# Patient Record
Sex: Male | Born: 1970 | Race: White | Hispanic: No | Marital: Married | Smoking: Former smoker
Health system: Southern US, Community
[De-identification: ages and names within clinical notes are randomized; demographics above are authoritative.]

## PROBLEM LIST (undated history)

## (undated) DIAGNOSIS — G4730 Sleep apnea, unspecified: Secondary | ICD-10-CM

## (undated) DIAGNOSIS — Z1331 Encounter for screening for depression: Secondary | ICD-10-CM

## (undated) DIAGNOSIS — F419 Anxiety disorder, unspecified: Secondary | ICD-10-CM

## (undated) DIAGNOSIS — E782 Mixed hyperlipidemia: Secondary | ICD-10-CM

## (undated) DIAGNOSIS — E785 Hyperlipidemia, unspecified: Secondary | ICD-10-CM

## (undated) DIAGNOSIS — E669 Obesity, unspecified: Secondary | ICD-10-CM

## (undated) DIAGNOSIS — Z8616 Personal history of COVID-19: Secondary | ICD-10-CM

## (undated) DIAGNOSIS — R0609 Other forms of dyspnea: Secondary | ICD-10-CM

## (undated) DIAGNOSIS — J45909 Unspecified asthma, uncomplicated: Secondary | ICD-10-CM

## (undated) DIAGNOSIS — R0600 Dyspnea, unspecified: Secondary | ICD-10-CM

## (undated) DIAGNOSIS — F325 Major depressive disorder, single episode, in full remission: Secondary | ICD-10-CM

## (undated) HISTORY — DX: Encounter for screening for depression: Z13.31

## (undated) HISTORY — DX: Obesity, unspecified: E66.9

## (undated) HISTORY — DX: Anxiety disorder, unspecified: F41.9

## (undated) HISTORY — DX: Hyperlipidemia, unspecified: E78.5

## (undated) HISTORY — DX: Unspecified asthma, uncomplicated: J45.909

## (undated) HISTORY — DX: Major depressive disorder, single episode, in full remission: F32.5

## (undated) HISTORY — DX: Mixed hyperlipidemia: E78.2

## (undated) HISTORY — PX: NO PAST SURGERIES: SHX2092

## (undated) HISTORY — PX: OTHER SURGICAL HISTORY: SHX169

## (undated) HISTORY — DX: Personal history of COVID-19: Z86.16

## (undated) HISTORY — DX: Dyspnea, unspecified: R06.00

## (undated) HISTORY — DX: Sleep apnea, unspecified: G47.30

## (undated) HISTORY — DX: Other forms of dyspnea: R06.09

---

## 2010-08-04 ENCOUNTER — Ambulatory Visit: Payer: Self-pay | Admitting: Cardiovascular Disease

## 2010-08-13 ENCOUNTER — Ambulatory Visit: Payer: Self-pay | Admitting: Cardiovascular Disease

## 2011-03-02 NOTE — Assessment & Plan Note (Signed)
[facility]                        [facility] OFFICE NOTE   NAME:ASBILLDelmos, [name]                         MRN:          [id number]  DATE:08/13/2010                            DOB:          1971-07-05    Francisco Underwood is a 40 year old gentleman who is here today for a followup  visit.  He has the following problem list:  1. Sleep apnea.  2. Hyperlipidemia.  3. Anxiety.  4. Previous tobacco use.   INTERVAL HISTORY:  I evaluated [name] recently for symptoms of  atypical chest pain and exertional dyspnea.  Some of his episodes were  happening with activities, but it was not on a consistent basis.  He  does not exercise regularly.  He has not had any changes in his  symptoms.  Most of his symptoms right now are exertional dyspnea and  occasional tightness in his chest at extreme physical activities.   PHYSICAL EXAMINATION:  VITAL SIGNS:  Weight is 193.6 pounds, blood  pressure is 119/73, pulse is 78, oxygen saturation is 97% on room air.  NECK:  No JVD or carotid bruits.  LUNGS:  Clear to auscultation.  HEART:  Regular rate and rhythm with no gallops or murmurs.  ABDOMEN:  Benign, nontender, nondistended.  EXTREMITIES:  With no clubbing, cyanosis, or edema.   IMPRESSION:  Atypical chest pain and exertional dyspnea.  He underwent a  stress echocardiogram.  Overall, he was able to exercise for 11 minutes  and achieved a maximum heart rate of 171.  He had no chest pain at that  time, no EKG changes, and no echocardiographic findings.  Overall, he  has normal LV systolic function with no evidence of ischemia by  echocardiogram.  Based on the high workload that he achieved, it is very  doubtful that his symptoms are cardiac in nature.  His exercise capacity  was actually good.  No need for further cardiac evaluation at this time.  I asked him to continue taking aspirin 81 mg once daily.  I advised him  to start an exercise program with at least 30  minutes of aerobic cardiac  exercises at least five times a week.  I think some weight loss will  help his sleep apnea as well.  He is to follow up with me on an as-  needed basis if his symptoms get worse.     Francisco Bears, MD  Electronically Signed    MA/MedQ  DD: 08/13/2010  DT: 08/14/2010  Job #: [id number]

## 2011-03-02 NOTE — Letter (Signed)
August 04, 2010    Lucila Maine, M.D.  [address]  [place], [place]  [address]   RE:  Francisco Underwood, Francisco Underwood  MRN:  [id number]  /  DOB:  August 01, 1971   Dear Dr. Lorin Picket:   Thank you for referring Francisco Underwood for further cardiac evaluation.  As  you are aware, this is a pleasant 40 year old gentleman with the  following problem list:  1. Sleep apnea on CPAP.  2. Hyperlipidemia.  3. Anxiety.  4. Previous tobacco use.   CLINICAL HISTORY:  Francisco Underwood is here today for evaluation of chest pain  and dyspnea.  He has noticed this over the last few weeks.  He describes  substernal chest tightness with which happens when he involves in some  kind of exercise or exerting himself to a certain point.  The discomfort  does not radiate.  It resolves with rest.  It lasts for a few minutes.  What bothers him more though it is getting out of breath easily with  activity.  He seems to have left arm discomfort also, but that does not  seem to be at the same time with his chest discomfort.  The patient has  not been doing any regular exercise in the last few years.  He has no  previous cardiac history.   PAST MEDICAL HISTORY:  Is as outlined above.   MEDICATIONS:  1. Wellbutrin 125 mg once daily.  2. Lexapro once daily.  3. Aspirin 81 mg once daily.   ALLERGIES:  No known drug allergies.   SOCIAL HISTORY:  He quit smoking in 1999.  He denies any recreational or  drug use.  He drinks coffees a day.  The patient is married and has  three kids.   PAST SURGICAL HISTORY:  None.   FAMILY HISTORY:  Negative for premature coronary artery disease.  There  is family history of hypertension and diabetes.   REVIEW OF SYSTEMS:  This is remarkable for chest pain and dyspnea as  outlined above.  There is also anxiety.  A full review of system was  performed and is otherwise negative.   PHYSICAL EXAMINATION:  GENERAL APPEARANCE:  The patient is pleasant and  appears to be in no acute distress.  VITAL SIGNS:   Weight is 192.6 pounds, blood pressure is 134/83, pulse is  99, oxygen saturation is 97% on room air.  HEENT:  Normocephalic, atraumatic.  NECK:  No JVD or carotid bruits.  RESPIRATORY:  Normal respiratory effort with no use of accessory  muscles.  Auscultation reveals normal breath sounds.  CARDIOVASCULAR:  Normal PMI.  Normal S1, S2 with no gallops or murmurs.  ABDOMEN:  Benign, nontender, nondistended.  EXTREMITIES:  With no clubbing, cyanosis or edema.  SKIN:  Skin is warm and dry with no rash.  PSYCHIATRIC:  He is alert, oriented x3 with normal mood and affect.  MUSCULOSKELETAL:  There is normal muscle strength in the upper and lower  extremities.   His electrocardiogram from today was reviewed which showed normal sinus  rhythm with nonspecific ST changes in the inferior leads.   IMPRESSION:  Chest pain:  His symptoms have been mostly exertional.  Differential diagnosis include underlying ischemic heart disease versus  physical deconditioning due to lack of exercise.  His risk factors  include hyperlipidemia and previous history of smoking.  Further testing  is warranted.  I recommend proceeding with a stress echocardiogram for  further evaluation.  In the meantime, will continue with aspirin once  daily.  He will follow up after stress test for further recommendations.   Thank you for allowing me to participate in the care of your patient.    Sincerely,      Lorine Bears, MD  Electronically Signed    MA/MedQ  DD: 08/04/2010  DT: 08/04/2010  Job #: [id number]

## 2011-06-09 ENCOUNTER — Encounter: Payer: Self-pay | Admitting: Cardiovascular Disease

## 2011-06-17 ENCOUNTER — Encounter: Payer: Self-pay | Admitting: Cardiovascular Disease

## 2018-07-05 DIAGNOSIS — Z1339 Encounter for screening examination for other mental health and behavioral disorders: Secondary | ICD-10-CM | POA: Diagnosis not present

## 2018-07-05 DIAGNOSIS — M62838 Other muscle spasm: Secondary | ICD-10-CM | POA: Diagnosis not present

## 2018-07-05 DIAGNOSIS — M5412 Radiculopathy, cervical region: Secondary | ICD-10-CM | POA: Diagnosis not present

## 2018-07-05 DIAGNOSIS — L309 Dermatitis, unspecified: Secondary | ICD-10-CM | POA: Diagnosis not present

## 2018-12-29 DIAGNOSIS — Z125 Encounter for screening for malignant neoplasm of prostate: Secondary | ICD-10-CM | POA: Diagnosis not present

## 2018-12-29 DIAGNOSIS — E669 Obesity, unspecified: Secondary | ICD-10-CM | POA: Diagnosis not present

## 2018-12-29 DIAGNOSIS — Z0000 Encounter for general adult medical examination without abnormal findings: Secondary | ICD-10-CM | POA: Diagnosis not present

## 2018-12-29 DIAGNOSIS — Z6831 Body mass index (BMI) 31.0-31.9, adult: Secondary | ICD-10-CM | POA: Diagnosis not present

## 2018-12-29 DIAGNOSIS — Z13220 Encounter for screening for lipoid disorders: Secondary | ICD-10-CM | POA: Diagnosis not present

## 2018-12-29 DIAGNOSIS — Z1331 Encounter for screening for depression: Secondary | ICD-10-CM | POA: Diagnosis not present

## 2019-11-06 DIAGNOSIS — U071 COVID-19: Secondary | ICD-10-CM | POA: Diagnosis not present

## 2019-11-06 DIAGNOSIS — R0981 Nasal congestion: Secondary | ICD-10-CM | POA: Diagnosis not present

## 2019-11-16 DIAGNOSIS — U071 COVID-19: Secondary | ICD-10-CM | POA: Diagnosis not present

## 2020-02-28 DIAGNOSIS — R0600 Dyspnea, unspecified: Secondary | ICD-10-CM | POA: Diagnosis not present

## 2020-02-28 DIAGNOSIS — E782 Mixed hyperlipidemia: Secondary | ICD-10-CM | POA: Diagnosis not present

## 2020-02-28 DIAGNOSIS — F325 Major depressive disorder, single episode, in full remission: Secondary | ICD-10-CM | POA: Diagnosis not present

## 2020-02-28 DIAGNOSIS — J45909 Unspecified asthma, uncomplicated: Secondary | ICD-10-CM | POA: Diagnosis not present

## 2020-03-05 ENCOUNTER — Encounter: Payer: Self-pay | Admitting: General Practice

## 2020-04-02 ENCOUNTER — Other Ambulatory Visit: Payer: Self-pay

## 2020-04-02 ENCOUNTER — Ambulatory Visit: Payer: BC Managed Care – PPO | Admitting: Cardiology

## 2020-04-02 DIAGNOSIS — R0600 Dyspnea, unspecified: Secondary | ICD-10-CM | POA: Diagnosis not present

## 2020-04-02 DIAGNOSIS — E782 Mixed hyperlipidemia: Secondary | ICD-10-CM

## 2020-04-02 DIAGNOSIS — R0609 Other forms of dyspnea: Secondary | ICD-10-CM | POA: Insufficient documentation

## 2020-04-02 HISTORY — DX: Dyspnea, unspecified: R06.00

## 2020-04-02 HISTORY — DX: Other forms of dyspnea: R06.09

## 2020-04-02 HISTORY — DX: Mixed hyperlipidemia: E78.2

## 2020-04-02 NOTE — Progress Notes (Signed)
Cardiology Office Note:    Date:  04/02/2020   ID:  Francisco Underwood, DOB 10/07/1971, MRN [id number]  PCP:  Elenore Paddy, NP  Cardiologist:  Jenean Lindau, MD   Referring MD: Elenore Paddy, NP    ASSESSMENT:    1. DOE (dyspnea on exertion)   2. Mixed dyslipidemia    PLAN:    In order of problems listed above:  1. Primary prevention stressed with the patient.  Importance of compliance with diet medication stressed and he vocalized understanding. 2. Dyspnea on exertion: Patient has multiple risk factors for coronary artery disease and therefore we will do a Lexiscan sestamibi to understand his symptoms.  This will give [place] any objective evidence of possible coronary artery disease 3. Mixed dyslipidemia: Diet was emphasized.  Weight reduction was stressed.  He is promising to comply. 4. History of sleep apnea: Sleep health issues were discussed.  This is managed by his primary care provider. 5. Patient will be seen in follow-up appointment in 6 months or earlier if the patient has any concerns    Medication Adjustments/Labs and Tests Ordered: Current medicines are reviewed at length with the patient today.  Concerns regarding medicines are outlined above.  No orders of the defined types were placed in this encounter.  No orders of the defined types were placed in this encounter.    History of Present Illness:    Francisco Underwood is a 49 y.o. male who is being seen today for the evaluation of dyspnea on exertion at the request of Elenore Paddy, NP.  Patient is a pleasant 49 year old male.  He has past medical history of mixed dyslipidemia.  He mentions to me that he had Covid infection earlier during the year.  He complains of shortness of breath on exertion.  This is affected to some extent his quality of life.  No chest pain orthopnea or PND.  No chest tightness.  He does not exercise on a regular basis.  He works full-time.  Sexual activity does not bring on the symptoms.  At the  time of my evaluation, the patient is alert awake oriented and in no distress.  Past Medical History:  Diagnosis Date  . Anxiety   . Asthma, extrinsic   . Depression screening   . Dyspnea on exertion   . History of COVID-19   . HLD (hyperlipidemia)   . Major depression in remission ([place])   . Mixed hyperlipidemia   . Obesity   . Sleep apnea   . Sleep apnea     Past Surgical History:  Procedure Laterality Date  . NO PAST SURGERIES      Current Medications: Current Meds  Medication Sig  . beclomethasone (QVAR) 80 MCG/ACT inhaler Inhale 1 puff into the lungs 2 (two) times daily.  Marland Kitchen buPROPion (WELLBUTRIN XL) 150 MG 24 hr tablet Take 150 mg by mouth daily.    Marland Kitchen escitalopram (LEXAPRO) 20 MG tablet Take 20 mg by mouth daily.    . pravastatin (PRAVACHOL) 20 MG tablet Take 20 mg by mouth at bedtime.  . triamcinolone cream (KENALOG) 0.1 % Apply 1 application topically 2 (two) times daily.     Allergies:   Patient has no known allergies.   Social History   Socioeconomic History  . Marital status: Married    Spouse name: Not on file  . Number of children: 3  . Years of education: Not on file  . Highest education level: Not on file  Occupational History  .  Not on file  Tobacco Use  . Smoking status: Former Smoker    Quit date: 10/18/1989    Years since quitting: 30.4  . Smokeless tobacco: Never Used  Substance and Sexual Activity  . Alcohol use: No  . Drug use: No  . Sexual activity: Not on file  Other Topics Concern  . Not on file  Social History Narrative  . Not on file   Social Determinants of Health   Financial Resource Strain:   . Difficulty of Paying Living Expenses:   Food Insecurity:   . Worried About Programme researcher, broadcasting/film/video in the Last Year:   . Barista in the Last Year:   Transportation Needs:   . Freight forwarder (Medical):   Marland Kitchen Lack of Transportation (Non-Medical):   Physical Activity:   . Days of Exercise per Week:   . Minutes of Exercise  per Session:   Stress:   . Feeling of Stress :   Social Connections:   . Frequency of Communication with Friends and Family:   . Frequency of Social Gatherings with Friends and Family:   . Attends Religious Services:   . Active Member of Clubs or Organizations:   . Attends Banker Meetings:   Marland Kitchen Marital Status:      Family History: The patient's family history includes Diabetes in an other family member; Heart attack in his maternal grandfather and maternal uncle; Hypertension in an other family member.  ROS:   Please see the history of present illness.    All other systems reviewed and are negative.  EKGs/Labs/Other Studies Reviewed:    The following studies were reviewed today: I discussed my findings with the patient at length.  EKG with sinus rhythm and nonspecific ST-T changes.   Recent Labs: No results found for requested labs within last 8760 hours.  Recent Lipid Panel No results found for: CHOL, TRIG, HDL, CHOLHDL, VLDL, LDLCALC, LDLDIRECT  Physical Exam:    VS:  BP 132/82   Pulse 76   Ht 5\' 11"  (1.803 m)   Wt 220 lb 12.8 oz (100.2 kg)   SpO2 97%   BMI 30.80 kg/m     Wt Readings from Last 3 Encounters:  04/02/20 220 lb 12.8 oz (100.2 kg)     GEN: Patient is in no acute distress HEENT: Normal NECK: No JVD; No carotid bruits LYMPHATICS: No lymphadenopathy CARDIAC: S1 S2 regular, 2/6 systolic murmur at the apex. RESPIRATORY:  Clear to auscultation without rales, wheezing or rhonchi  ABDOMEN: Soft, non-tender, non-distended MUSCULOSKELETAL:  No edema; No deformity  SKIN: Warm and dry NEUROLOGIC:  Alert and oriented x 3 PSYCHIATRIC:  Normal affect    Signed, 04/04/20, MD  04/02/2020 2:22 PM    [facility]

## 2020-04-02 NOTE — Patient Instructions (Signed)
Medication Instructions:  No medication changes. *If you need a refill on your cardiac medications before your next appointment, please call your pharmacy*   Lab Work: None ordered If you have labs (blood work) drawn today and your tests are completely normal, you will receive your results only by: . MyChart Message (if you have MyChart) OR . A paper copy in the mail If you have any lab test that is abnormal or we need to change your treatment, we will call you to review the results.   Testing/Procedures: Your physician has requested that you have an echocardiogram. Echocardiography is a painless test that uses sound waves to create images of your heart. It provides your doctor with information about the size and shape of your heart and how well your heart's chambers and valves are working. This procedure takes approximately one hour. There are no restrictions for this procedure.  Your physician has requested that you have a lexiscan myoview. For further information please visit www.[email or web address]. Please follow instruction sheet, as given.  The test will take approximately 3 to 4 hours to complete; you may bring reading material.  If someone comes with you to your appointment, they will need to remain in the main lobby due to limited space in the testing area.   How to prepare for your Myocardial Perfusion Test: . Do not eat or drink 3 hours prior to your test, except you may have water. . Do not consume products containing caffeine (regular or decaffeinated) 12 hours prior to your test. (ex: coffee, chocolate, sodas, tea). . Do bring a list of your current medications with you.  If not listed below, you may take your medications as normal. . Do wear comfortable clothes (no dresses or overalls) and walking shoes, tennis shoes preferred (No heels or open toe shoes are allowed). . Do NOT wear cologne, perfume, aftershave, or lotions (deodorant is allowed). . If these instructions are not  followed, your test will have to be rescheduled.    Follow-Up: At [facility], you and your health needs are our priority.  As part of our continuing mission to provide you with exceptional heart care, we have created designated Provider Care Teams.  These Care Teams include your primary Cardiologist (physician) and Advanced Practice Providers (APPs -  Physician Assistants and Nurse Practitioners) who all work together to provide you with the care you need, when you need it.  We recommend signing up for the patient portal called "MyChart".  Sign up information is provided on this After Visit Summary.  MyChart is used to connect with patients for Virtual Visits (Telemedicine).  Patients are able to view lab/test results, encounter notes, upcoming appointments, etc.  Non-urgent messages can be sent to your provider as well.   To learn more about what you can do with MyChart, go to [email or web address].    Your next appointment:   3 month(s)  The format for your next appointment:   In Person  Provider:   Rajan Revankar, MD   Other Instructions:  Nuclear Medicine Exam A nuclear medicine exam is a safe and painless imaging test. It helps your health care provider detect and diagnose diseases. It also provides information about the ways your organs work and how they are structured. For a nuclear medicine exam, you will be given a radioactive tracer. This substance is absorbed by your body's organs. A large scanning machine detects the tracer and creates pictures of the areas that your health care provider wants to know more   about. There are several kinds of nuclear medicine exams. They include the following:  CT scan.  MRI scan.  PET scan.  SPECT scan. Tell your health care provider about:  Any allergies you have.  All medicines you are taking, including vitamins, herbs, eye drops, creams, and over-the-counter medicines.  Any problems you or family members have had with  anesthetic medicines.  Any blood disorders you have.  Any surgeries you have had.  Any medical conditions you have.  Whether you are pregnant or may be pregnant.  Whether you are nursing. What are the risks? Generally, this is a safe procedure. However, problems may occur, such as an allergic reaction to the tracer, but this is rare. What happens before the procedure? Medicines Ask your health care provider about:  Changing or stopping your regular medicines. This is especially important if you are taking diabetes medicines or blood thinners.  Taking medicines such as aspirin and ibuprofen. These medicines can thin your blood. Do not take these medicines unless your health care provider tells you to take them.  Taking over-the-counter medicines, vitamins, herbs, and supplements. General instructions  Follow instructions from your health care provider about eating or drinking restrictions.  Do not wear jewelry.  Wear loose, comfortable clothing. You may be asked to wear a hospital gown for the procedure.  Bring previous imaging studies, such as X-rays, with you to the exam if they are available. What happens during the procedure?   An IV may be inserted into one of your veins.  You will be asked to lie on a table or sit in a chair.  You will be given the radioactive tracer. You may get: ? A pill or liquid to swallow. ? An injection. ? Medicine through your IV. ? A gas to inhale.  A large scanning machine will be used to create images of your body. After the pictures are taken, you may have to wait so your health care provider can make sure that enough images were taken. The procedure may vary among health care providers and hospitals. What happens after the procedure?  You may go home after the procedure and return to your usual activities, unless your health care provider tells you otherwise.  Drink enough water to keep your urine pale yellow. This helps to remove  the radioactive tracer from your body.  It is up to you to get the results of your procedure. Ask your health care provider, or the department that is doing the procedure, when your results will be ready.  Get help right away if you have problems breathing. Summary  A nuclear medicine exam is a safe and painless imaging test that provides information about how your organs are working. It is also used to detect and diagnose diseases of various body organs.  Follow your health care provider's instructions about eating and drinking restrictions. Ask whether you should change or stop any medicines.  During the procedure, you will be given a radioactive tracer. A large scanning machine will create images of your body.  You may go home after the procedure and return to your regular activities. Follow your health care provider's instructions.  Get help right away if you have problems breathing. This information is not intended to replace advice given to you by your health care provider. Make sure you discuss any questions you have with your health care provider. Document Revised: 08/23/2018 Document Reviewed: 08/23/2018 Elsevier Patient Education  2020 Elsevier Inc.  Echocardiogram An echocardiogram is a   procedure that uses painless sound waves (ultrasound) to produce an image of the heart. Images from an echocardiogram can provide important information about:  Signs of coronary artery disease (CAD).  Aneurysm detection. An aneurysm is a weak or damaged part of an artery wall that bulges out from the normal force of blood pumping through the body.  Heart size and shape. Changes in the size or shape of the heart can be associated with certain conditions, including heart failure, aneurysm, and CAD.  Heart muscle function.  Heart valve function.  Signs of a past heart attack.  Fluid buildup around the heart.  Thickening of the heart muscle.  A tumor or infectious growth around the heart  valves. Tell a health care provider about:  Any allergies you have.  All medicines you are taking, including vitamins, herbs, eye drops, creams, and over-the-counter medicines.  Any blood disorders you have.  Any surgeries you have had.  Any medical conditions you have.  Whether you are pregnant or may be pregnant. What are the risks? Generally, this is a safe procedure. However, problems may occur, including:  Allergic reaction to dye (contrast) that may be used during the procedure. What happens before the procedure? No specific preparation is needed. You may eat and drink normally. What happens during the procedure?   An IV tube may be inserted into one of your veins.  You may receive contrast through this tube. A contrast is an injection that improves the quality of the pictures from your heart.  A gel will be applied to your chest.  A wand-like tool (transducer) will be moved over your chest. The gel will help to transmit the sound waves from the transducer.  The sound waves will harmlessly bounce off of your heart to allow the heart images to be captured in real-time motion. The images will be recorded on a computer. The procedure may vary among health care providers and hospitals. What happens after the procedure?  You may return to your normal, everyday life, including diet, activities, and medicines, unless your health care provider tells you not to do that. Summary  An echocardiogram is a procedure that uses painless sound waves (ultrasound) to produce an image of the heart.  Images from an echocardiogram can provide important information about the size and shape of your heart, heart muscle function, heart valve function, and fluid buildup around your heart.  You do not need to do anything to prepare before this procedure. You may eat and drink normally.  After the echocardiogram is completed, you may return to your normal, everyday life, unless your health care  provider tells you not to do that. This information is not intended to replace advice given to you by your health care provider. Make sure you discuss any questions you have with your health care provider. Document Revised: 01/25/2019 Document Reviewed: 11/06/2016 Elsevier Patient Education  2020 Elsevier Inc.  

## 2020-04-03 ENCOUNTER — Telehealth (HOSPITAL_COMMUNITY): Payer: Self-pay | Admitting: *Deleted

## 2020-04-03 NOTE — Telephone Encounter (Signed)
Left message on voicemail in reference to upcoming appointment scheduled for 04/09/20. Phone number given for a call back so details instructions can be given.  Francisco Underwood   

## 2020-04-08 DIAGNOSIS — D1801 Hemangioma of skin and subcutaneous tissue: Secondary | ICD-10-CM | POA: Diagnosis not present

## 2020-04-08 DIAGNOSIS — D485 Neoplasm of uncertain behavior of skin: Secondary | ICD-10-CM | POA: Diagnosis not present

## 2020-04-08 DIAGNOSIS — L820 Inflamed seborrheic keratosis: Secondary | ICD-10-CM | POA: Diagnosis not present

## 2020-04-08 DIAGNOSIS — I8310 Varicose veins of unspecified lower extremity with inflammation: Secondary | ICD-10-CM | POA: Diagnosis not present

## 2020-04-09 ENCOUNTER — Ambulatory Visit (INDEPENDENT_AMBULATORY_CARE_PROVIDER_SITE_OTHER): Payer: BC Managed Care – PPO

## 2020-04-09 ENCOUNTER — Other Ambulatory Visit: Payer: Self-pay

## 2020-04-09 DIAGNOSIS — R0600 Dyspnea, unspecified: Secondary | ICD-10-CM

## 2020-04-09 DIAGNOSIS — R0609 Other forms of dyspnea: Secondary | ICD-10-CM

## 2020-04-09 LAB — MYOCARDIAL PERFUSION IMAGING
LV dias vol: 79 mL (ref 62–150)
LV sys vol: 35 mL
Peak HR: 102 {beats}/min
Rest HR: 74 {beats}/min
SDS: 4
SRS: 5
SSS: 7
TID: 1.03

## 2020-04-09 MED ORDER — REGADENOSON 0.4 MG/5ML IV SOLN
0.4000 mg | Freq: Once | INTRAVENOUS | Status: AC
  Administered 2020-04-09: 0.4 mg via INTRAVENOUS

## 2020-04-09 MED ORDER — TECHNETIUM TC 99M TETROFOSMIN IV KIT
9.5000 | PACK | Freq: Once | INTRAVENOUS | Status: AC | PRN
Start: 2020-04-09 — End: 2020-04-09
  Administered 2020-04-09: 9.5 via INTRAVENOUS

## 2020-04-09 MED ORDER — TECHNETIUM TC 99M TETROFOSMIN IV KIT
29.5000 | PACK | Freq: Once | INTRAVENOUS | Status: AC | PRN
  Administered 2020-04-09: 29.5 via INTRAVENOUS

## 2020-04-15 ENCOUNTER — Ambulatory Visit (INDEPENDENT_AMBULATORY_CARE_PROVIDER_SITE_OTHER): Payer: BC Managed Care – PPO

## 2020-04-15 ENCOUNTER — Other Ambulatory Visit: Payer: Self-pay

## 2020-04-15 DIAGNOSIS — R0609 Other forms of dyspnea: Secondary | ICD-10-CM

## 2020-04-15 DIAGNOSIS — R0600 Dyspnea, unspecified: Secondary | ICD-10-CM

## 2020-04-15 NOTE — Progress Notes (Signed)
complete echocardiogram performed.  Jimmy Howie Rufus RDCS, RVT 

## 2020-07-02 DIAGNOSIS — E669 Obesity, unspecified: Secondary | ICD-10-CM | POA: Insufficient documentation

## 2020-07-02 DIAGNOSIS — G4730 Sleep apnea, unspecified: Secondary | ICD-10-CM | POA: Insufficient documentation

## 2020-07-02 DIAGNOSIS — E782 Mixed hyperlipidemia: Secondary | ICD-10-CM | POA: Insufficient documentation

## 2020-07-02 DIAGNOSIS — E785 Hyperlipidemia, unspecified: Secondary | ICD-10-CM | POA: Insufficient documentation

## 2020-07-02 DIAGNOSIS — R0609 Other forms of dyspnea: Secondary | ICD-10-CM | POA: Insufficient documentation

## 2020-07-02 DIAGNOSIS — F325 Major depressive disorder, single episode, in full remission: Secondary | ICD-10-CM | POA: Insufficient documentation

## 2020-07-02 DIAGNOSIS — Z8616 Personal history of COVID-19: Secondary | ICD-10-CM | POA: Insufficient documentation

## 2020-07-02 DIAGNOSIS — J45909 Unspecified asthma, uncomplicated: Secondary | ICD-10-CM | POA: Insufficient documentation

## 2020-07-02 DIAGNOSIS — Z1331 Encounter for screening for depression: Secondary | ICD-10-CM | POA: Insufficient documentation

## 2020-07-02 DIAGNOSIS — F419 Anxiety disorder, unspecified: Secondary | ICD-10-CM | POA: Insufficient documentation

## 2020-07-03 ENCOUNTER — Other Ambulatory Visit: Payer: Self-pay

## 2020-07-03 ENCOUNTER — Encounter: Payer: Self-pay | Admitting: Cardiology

## 2020-07-03 ENCOUNTER — Ambulatory Visit: Payer: BC Managed Care – PPO | Admitting: Cardiology

## 2020-07-03 VITALS — BP 134/76 | HR 86 | Ht 71.0 in | Wt 222.4 lb

## 2020-07-03 DIAGNOSIS — E782 Mixed hyperlipidemia: Secondary | ICD-10-CM | POA: Diagnosis not present

## 2020-07-03 DIAGNOSIS — R0600 Dyspnea, unspecified: Secondary | ICD-10-CM

## 2020-07-03 DIAGNOSIS — R0609 Other forms of dyspnea: Secondary | ICD-10-CM

## 2020-07-03 NOTE — Patient Instructions (Addendum)
Medication Instructions:  No medication changes. *If you need a refill on your cardiac medications before your next appointment, please call your pharmacy*   Lab Work: Your physician recommends that you return for lab work in: next few days. You need to have labs done when you are fasting.  You can come Monday through Friday 8:30 am to 12:00 pm and 1:15 to 4:30. You do not need to make an appointment as the order has already been placed. The labs you are going to have done are BMET, CBC, TSH, LFT and Lipids.  If you have labs (blood work) drawn today and your tests are completely normal, you will receive your results only by: Marland Kitchen MyChart Message (if you have MyChart) OR . A paper copy in the mail If you have any lab test that is abnormal or we need to change your treatment, we will call you to review the results.   Testing/Procedures: We will order CT coronary calcium score $150  Please call [phone] to schedule   [facility]  [address]  [place], [place] [address]   Follow-Up: At [facility], you and your health needs are our priority.  As part of our continuing mission to provide you with exceptional heart care, we have created designated Provider Care Teams.  These Care Teams include your primary Cardiologist (physician) and Advanced Practice Providers (APPs -  Physician Assistants and Nurse Practitioners) who all work together to provide you with the care you need, when you need it.  We recommend signing up for the patient portal called "MyChart".  Sign up information is provided on this After Visit Summary.  MyChart is used to connect with patients for Virtual Visits (Telemedicine).  Patients are able to view lab/test results, encounter notes, upcoming appointments, etc.  Non-urgent messages can be sent to your provider as well.   To learn more about what you can do with MyChart, go to [email or web address].    Your next appointment:   6 month(s)  The  format for your next appointment:   In Person  Provider:   Belva Crome, MD   Other Instructions  Coronary Calcium Scan A coronary calcium scan is an imaging test used to look for deposits of plaque in the inner lining of the blood vessels of the heart (coronary arteries). Plaque is made up of calcium, protein, and fatty substances. These deposits of plaque can partly clog and narrow the coronary arteries without producing any symptoms or warning signs. This puts a person at risk for a heart attack. This test is recommended for people who are at moderate risk for heart disease. The test can find plaque deposits before symptoms develop. Tell a health care provider about:  Any allergies you have.  All medicines you are taking, including vitamins, herbs, eye drops, creams, and over-the-counter medicines.  Any problems you or family members have had with anesthetic medicines.  Any blood disorders you have.  Any surgeries you have had.  Any medical conditions you have.  Whether you are pregnant or may be pregnant. What are the risks? Generally, this is a safe procedure. However, problems may occur, including:  Harm to a pregnant woman and her unborn baby. This test involves the use of radiation. Radiation exposure can be dangerous to a pregnant woman and her unborn baby. If you are pregnant or think you may be pregnant, you should not have this procedure done.  Slight increase in the risk of cancer. This is because  of the radiation involved in the test. What happens before the procedure? Ask your health care provider for any specific instructions on how to prepare for this procedure. You may be asked to avoid products that contain caffeine, tobacco, or nicotine for 4 hours before the procedure. What happens during the procedure?   You will undress and remove any jewelry from your neck or chest.  You will put on a hospital gown.  Sticky electrodes will be placed on your chest.  The electrodes will be connected to an electrocardiogram (ECG) machine to record a tracing of the electrical activity of your heart.  You will lie down on a curved bed that is attached to the CT scanner.  You may be given medicine to slow down your heart rate so that clear pictures can be created.  You will be moved into the CT scanner, and the CT scanner will take pictures of your heart. During this time, you will be asked to lie still and hold your breath for 2-3 seconds at a time while each picture of your heart is being taken. The procedure may vary among health care providers and hospitals. What happens after the procedure?  You can get dressed.  You can return to your normal activities.  It is up to you to get the results of your procedure. Ask your health care provider, or the department that is doing the procedure, when your results will be ready. Summary  A coronary calcium scan is an imaging test used to look for deposits of plaque in the inner lining of the blood vessels of the heart (coronary arteries). Plaque is made up of calcium, protein, and fatty substances.  Generally, this is a safe procedure. Tell your health care provider if you are pregnant or may be pregnant.  Ask your health care provider for any specific instructions on how to prepare for this procedure.  A CT scanner will take pictures of your heart.  You can return to your normal activities after the scan is done. This information is not intended to replace advice given to you by your health care provider. Make sure you discuss any questions you have with your health care provider. Document Revised: 04/24/2019 Document Reviewed: 04/24/2019 Elsevier Patient Education  2020 [employer].

## 2020-07-03 NOTE — Addendum Note (Signed)
Addended by: Eleonore Chiquito on: 07/03/2020 04:57 PM   Modules accepted: Orders

## 2020-07-03 NOTE — Progress Notes (Signed)
Cardiology Office Note:    Date:  07/03/2020   ID:  Francisco Underwood, DOB [birth year], MRN [id number]  PCP:  Julianne Handler, NP  Cardiologist:  Garwin Brothers, MD   Referring MD: Julianne Handler, NP    ASSESSMENT:    1. Mixed hyperlipidemia   2. Mixed dyslipidemia   3. DOE (dyspnea on exertion)    PLAN:    In order of problems listed above:  1. Primary prevention stressed with the patient.  Importance of compliance with diet medication stressed and he vocalized understanding. 2. Mixed dyslipidemia: Diet was emphasized.  He vocalized understanding.  He has abdominal obesity.  Exercise was stressed. 3. Coronary risk stratification: In view of this I will get him back in the next few days for fasting blood work including lipids.  I also discussed calcium scoring and is agreeable. 4. Patient will be seen in follow-up appointment in 6 months or earlier if the patient has any concerns    Medication Adjustments/Labs and Tests Ordered: Current medicines are reviewed at length with the patient today.  Concerns regarding medicines are outlined above.  No orders of the defined types were placed in this encounter.  No orders of the defined types were placed in this encounter.    No chief complaint on file.    History of Present Illness:    Francisco Underwood is a 49 y.o. male.  Patient was evaluated by me for dyspnea on exertion and chest discomfort.  He has history of mixed dyslipidemia.  He denies any problems at this time and takes care of activities of daily living.  No chest pain orthopnea or PND.  At the time of my evaluation, the patient is alert awake oriented and in no distress.  Past Medical History:  Diagnosis Date  . Anxiety   . Asthma, extrinsic   . Depression screening   . DOE (dyspnea on exertion) 04/02/2020  . Dyspnea on exertion   . History of COVID-19   . HLD (hyperlipidemia)   . Major depression in remission (HCC)   . Mixed dyslipidemia 04/02/2020  . Mixed  hyperlipidemia   . Obesity   . Sleep apnea   . Sleep apnea     Past Surgical History:  Procedure Laterality Date  . NO PAST SURGERIES      Current Medications: Current Meds  Medication Sig  . beclomethasone (QVAR) 80 MCG/ACT inhaler Inhale 1 puff into the lungs 2 (two) times daily.  Marland Kitchen buPROPion (WELLBUTRIN XL) 150 MG 24 hr tablet Take 150 mg by mouth daily.    Marland Kitchen escitalopram (LEXAPRO) 20 MG tablet Take 20 mg by mouth daily.    . nitroGLYCERIN (NITROSTAT) 0.4 MG SL tablet Place 0.4 mg under the tongue every 5 (five) minutes as needed.    . pravastatin (PRAVACHOL) 20 MG tablet Take 20 mg by mouth at bedtime.  . triamcinolone cream (KENALOG) 0.1 % Apply 1 application topically 2 (two) times daily.     Allergies:   Patient has no known allergies.   Social History   Socioeconomic History  . Marital status: Married    Spouse name: Not on file  . Number of children: 3  . Years of education: Not on file  . Highest education level: Not on file  Occupational History  . Not on file  Tobacco Use  . Smoking status: Former Smoker    Quit date: 10/18/1989    Years since quitting: 30.7  . Smokeless tobacco: Never Used  Substance  and Sexual Activity  . Alcohol use: No  . Drug use: No  . Sexual activity: Not on file  Other Topics Concern  . Not on file  Social History Narrative  . Not on file   Social Determinants of Health   Financial Resource Strain:   . Difficulty of Paying Living Expenses: Not on file  Food Insecurity:   . Worried About Programme researcher, broadcasting/film/video in the Last Year: Not on file  . Ran Out of Food in the Last Year: Not on file  Transportation Needs:   . Lack of Transportation (Medical): Not on file  . Lack of Transportation (Non-Medical): Not on file  Physical Activity:   . Days of Exercise per Week: Not on file  . Minutes of Exercise per Session: Not on file  Stress:   . Feeling of Stress : Not on file  Social Connections:   . Frequency of Communication with  Friends and Family: Not on file  . Frequency of Social Gatherings with Friends and Family: Not on file  . Attends Religious Services: Not on file  . Active Member of Clubs or Organizations: Not on file  . Attends Banker Meetings: Not on file  . Marital Status: Not on file     Family History: The patient's family history includes Diabetes in an other family member; Heart attack in his maternal grandfather and maternal uncle; Hypertension in an other family member.  ROS:   Please see the history of present illness.    All other systems reviewed and are negative.  EKGs/Labs/Other Studies Reviewed:    The following studies were reviewed today: IMPRESSIONS    1. Left ventricular ejection fraction, by estimation, is 55 to 60%. The  left ventricle has normal function. The left ventricle has no regional  wall motion abnormalities. Left ventricular diastolic parameters are  consistent with Grade I diastolic  dysfunction (impaired relaxation).  2. Right ventricular systolic function is normal. The right ventricular  size is normal. There is normal pulmonary artery systolic pressure.  3. The mitral valve is normal in structure. Trivial mitral valve  regurgitation. No evidence of mitral stenosis.  4. The aortic valve is normal in structure. Aortic valve regurgitation is  not visualized. No aortic stenosis is present.  5. The inferior vena cava is normal in size with greater than 50%  respiratory variability, suggesting right atrial pressure of 3 mmHg.   Study Highlights   The left ventricular ejection fraction is normal (55-65%).  Nuclear stress EF: 55%.  There was no ST segment deviation noted during stress.  Defect 1: There is a small defect of mild severity present in the basal inferior and apical lateral location.  This is a low risk study.  No evidence of ischemia or MI.  Normal gated images, normal EF.    Recent Labs: No results found for requested  labs within last 8760 hours.  Recent Lipid Panel No results found for: CHOL, TRIG, HDL, CHOLHDL, VLDL, LDLCALC, LDLDIRECT  Physical Exam:    VS:  BP 134/76   Pulse 86   Ht 5\' 11"  (1.803 m)   Wt 222 lb 6.4 oz (100.9 kg)   SpO2 98%   BMI 31.02 kg/m     Wt Readings from Last 3 Encounters:  07/03/20 222 lb 6.4 oz (100.9 kg)  04/09/20 220 lb (99.8 kg)  04/02/20 220 lb 12.8 oz (100.2 kg)     GEN: Patient is in no acute distress HEENT: Normal NECK:  No JVD; No carotid bruits LYMPHATICS: No lymphadenopathy CARDIAC: Hear sounds regular, 2/6 systolic murmur at the apex. RESPIRATORY:  Clear to auscultation without rales, wheezing or rhonchi  ABDOMEN: Soft, non-tender, non-distended MUSCULOSKELETAL:  No edema; No deformity  SKIN: Warm and dry NEUROLOGIC:  Alert and oriented x 3 PSYCHIATRIC:  Normal affect   Signed, Garwin Brothers, MD  07/03/2020 4:49 PM    [facility]

## 2020-07-25 ENCOUNTER — Ambulatory Visit (INDEPENDENT_AMBULATORY_CARE_PROVIDER_SITE_OTHER)
Admission: RE | Admit: 2020-07-25 | Discharge: 2020-07-25 | Disposition: A | Discharge status: Home / Self Care | Payer: Self-pay | Source: Ambulatory Visit | Attending: Cardiology | Admitting: Cardiology

## 2020-07-25 ENCOUNTER — Other Ambulatory Visit: Payer: Self-pay

## 2020-07-25 DIAGNOSIS — E782 Mixed hyperlipidemia: Secondary | ICD-10-CM

## 2020-07-25 DIAGNOSIS — R0609 Other forms of dyspnea: Secondary | ICD-10-CM

## 2020-07-25 DIAGNOSIS — R0600 Dyspnea, unspecified: Secondary | ICD-10-CM

## 2020-07-25 IMAGING — CT CT CARDIAC CORONARY ARTERY CALCIUM SCORE
3 series · 14 of 20 positions shown, 15 images · non-contrast
Comparison: Chest radiograph [DATE]
COMPARISON: Chest radiograph [DATE]

Addendum:
EXAM:
OVER-READ INTERPRETATION  CT CHEST

The following report is an over-read performed by radiologist Dr.
DEGNAN [REDACTED] on [DATE]. This over-read
does not include interpretation of cardiac or coronary anatomy or
pathology. The calcium score interpretation by the cardiologist is
attached.
CLINICAL DATA: Risk stratification of 49 yo W M
Coronary Calcium Score
TECHNIQUE: The patient was scanned on a Siemens Force scanner. Axial
non-contrast 3 mm slices were carried out through the heart. The
data set was analyzed on a dedicated work station and scored using
the Agatson method.

[Series 2: casc 3.0 bv41 2 bestdiast 69 % · axial · 0.38mm/px · z∈[-237,-150]mm · 4 of 49 slices shown, 5 images]
[im 10/49  vessel]
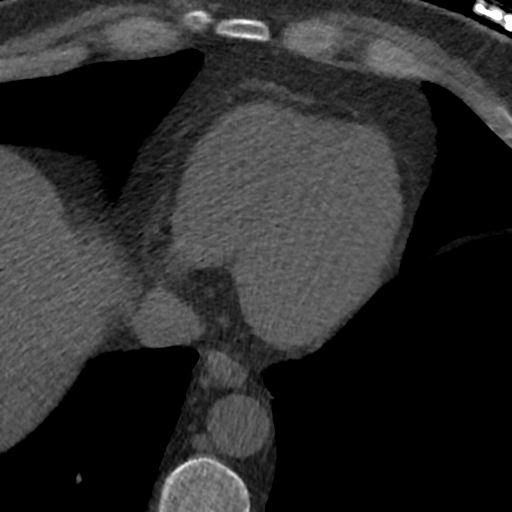
[im 10/49  lung]
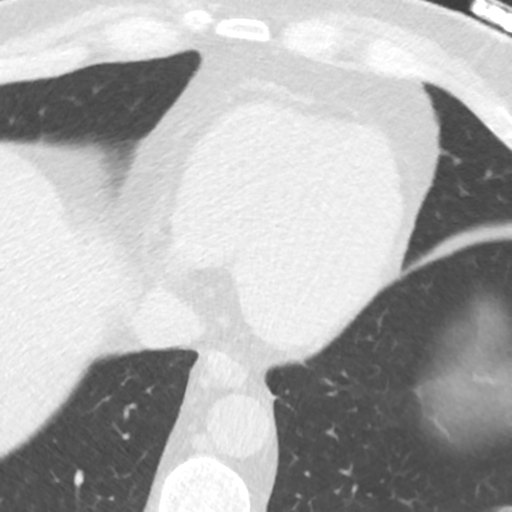
[im 20/49  vessel]
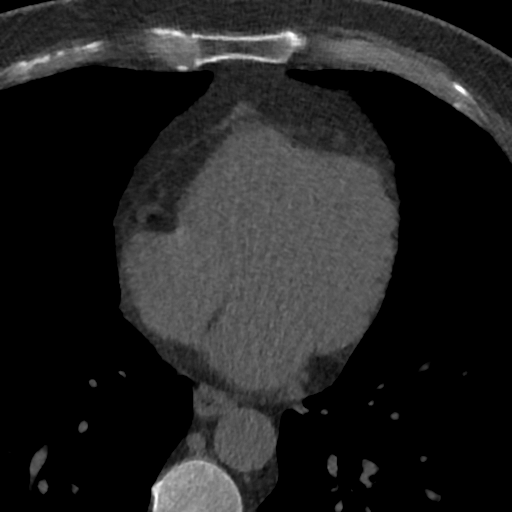
[im 29/49  vessel]
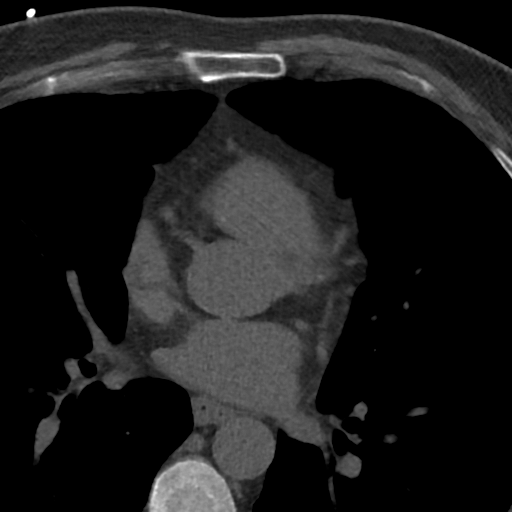
[im 39/49  vessel]
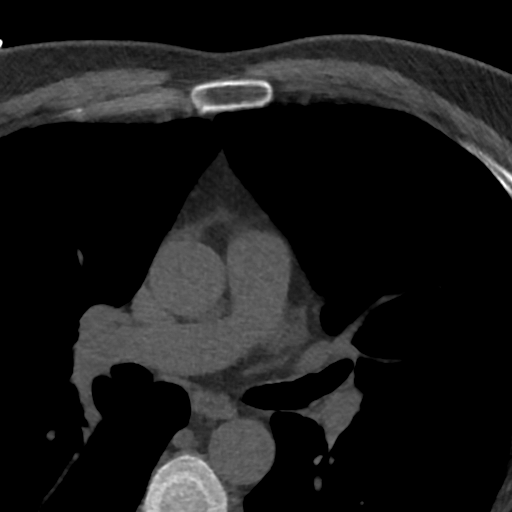

[Series 3: lung 72 % · axial · 0.77mm/px · z∈[-240,-144]mm · 5 of 49 slices shown]
[im 9/49  lung]
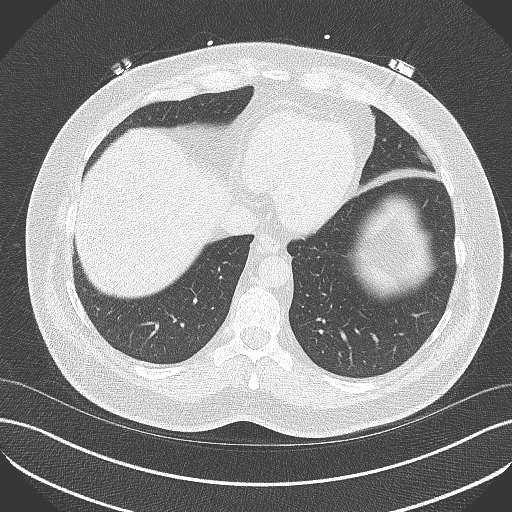
[im 17/49  lung]
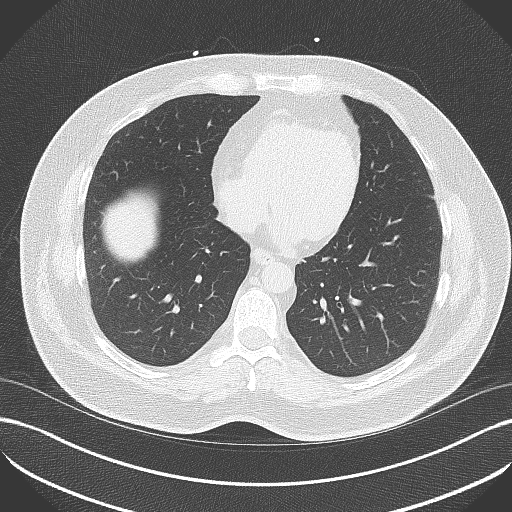
[im 25/49  lung]
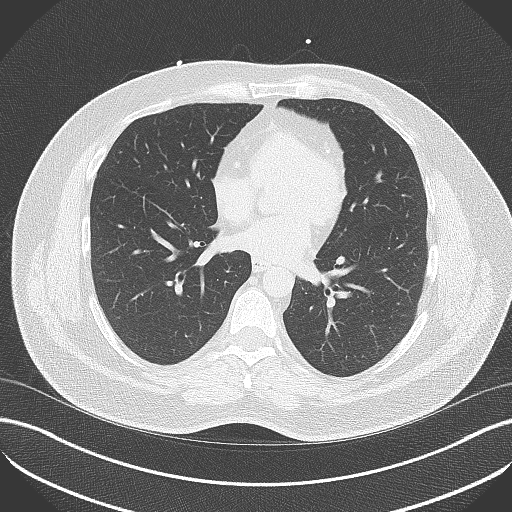
[im 33/49  lung]
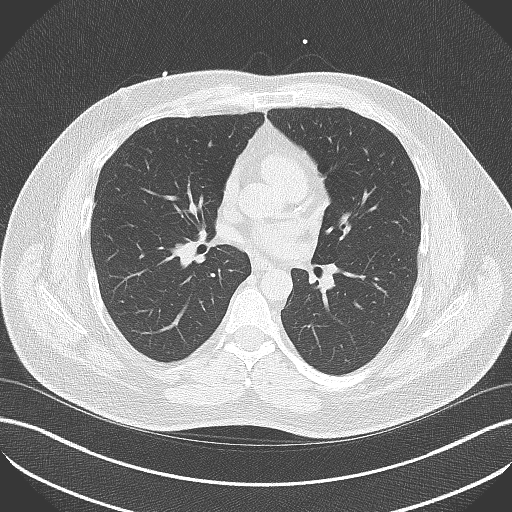
[im 41/49  lung]
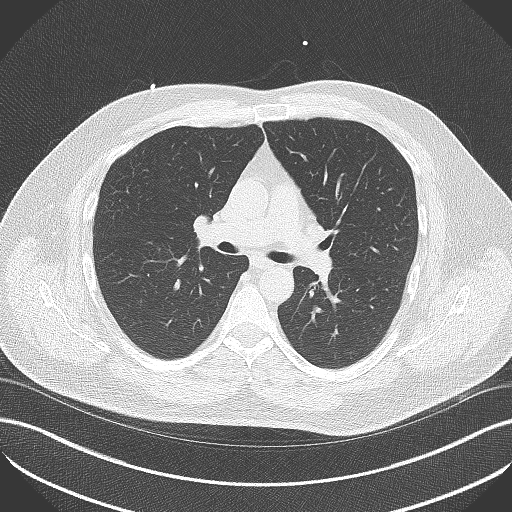

[Series 4: lung st 72 % · axial · 0.77mm/px · z∈[-240,-144]mm · 5 of 49 slices shown]
[im 9/49  lung]
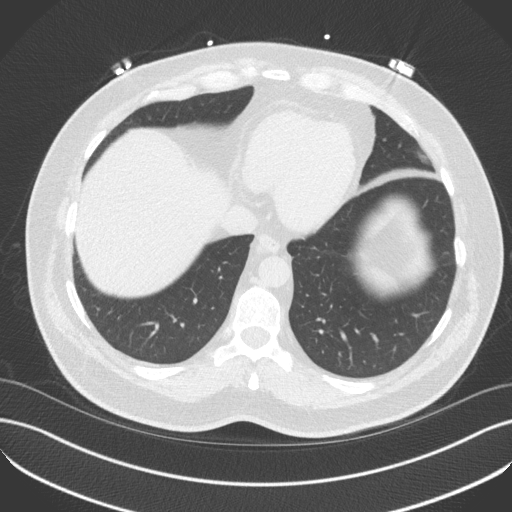
[im 17/49  lung]
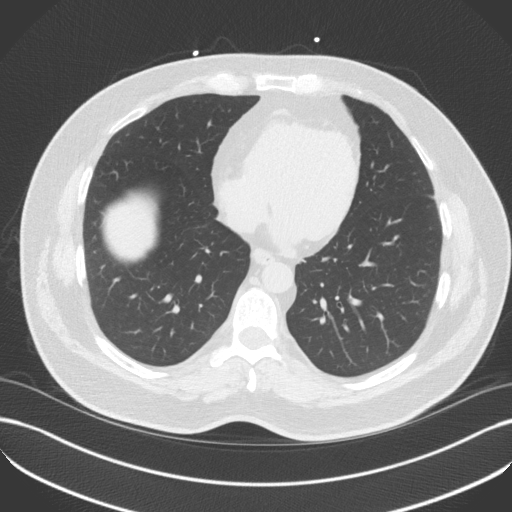
[im 25/49  lung]
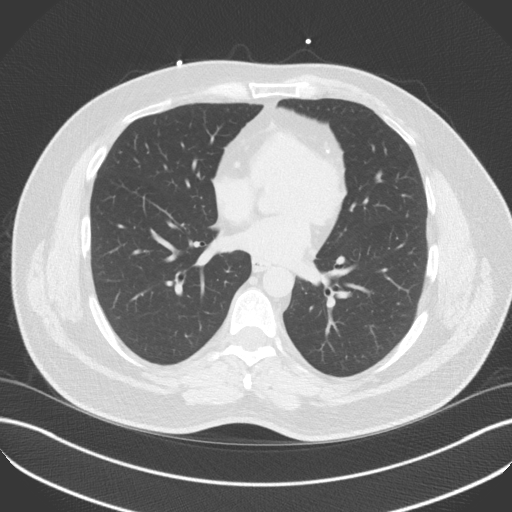
[im 33/49  lung]
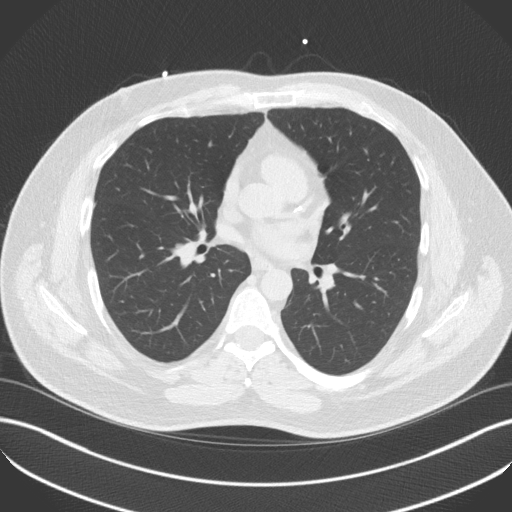
[im 41/49  lung]
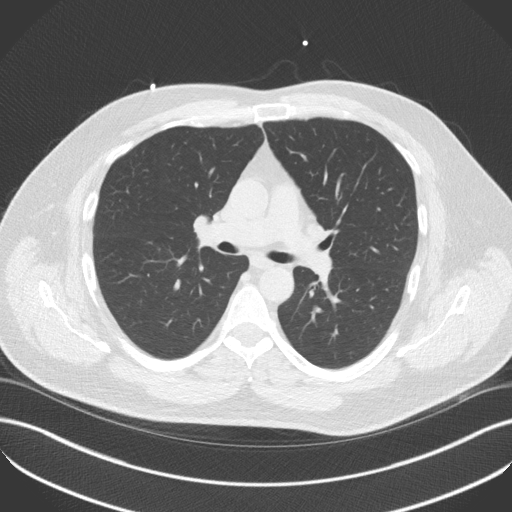

[14 of 20 positions shown; findings below may reference images not displayed]

FINDINGS: Vascular: Aortic atherosclerosis.

Mediastinum/Nodes: No imaged thoracic adenopathy.

Lungs/Pleura: No pleural fluid.  Clear imaged lungs.

Upper Abdomen: Moderate hepatic steatosis. Normal imaged portions of
the spleen, stomach.

Musculoskeletal: No acute osseous abnormality.
IMPRESSION: 1. No acute process in the imaged extracardiac chest.
2. Hepatic steatosis.
3. Aortic Atherosclerosis ([GL]-[GL]).
FINDINGS: Non-cardiac: See separate report from [REDACTED].

Descending Aorta Calcium: Score of

Pericardium: Normal

Coronary arteries:

LM: 0

LAD:

LCx: 0

RCA: 0

Total:
IMPRESSION: Coronary calcium score of 93.1. This was 86th percentile for age and
sex matched control. Calcium scoring also sent to PACs system.

Aortic Atherosclerosis noted

*** End of Addendum ***
EXAM:
OVER-READ INTERPRETATION  CT CHEST

The following report is an over-read performed by radiologist Dr.
DEGNAN [REDACTED] on [DATE]. This over-read
does not include interpretation of cardiac or coronary anatomy or
pathology. The calcium score interpretation by the cardiologist is
attached.
FINDINGS: Vascular: Aortic atherosclerosis.

Mediastinum/Nodes: No imaged thoracic adenopathy.

Lungs/Pleura: No pleural fluid.  Clear imaged lungs.

Upper Abdomen: Moderate hepatic steatosis. Normal imaged portions of
the spleen, stomach.

Musculoskeletal: No acute osseous abnormality.
IMPRESSION: 1. No acute process in the imaged extracardiac chest.
2. Hepatic steatosis.
3. Aortic Atherosclerosis ([GL]-[GL]).

## 2020-07-31 ENCOUNTER — Telehealth: Payer: Self-pay | Admitting: Cardiology

## 2020-07-31 NOTE — Telephone Encounter (Signed)
° ° °  Pt is returning Shonda's call to get CT result

## 2020-07-31 NOTE — Telephone Encounter (Signed)
Results reviewed with pt as per Dr. Revankar's note.  Pt verbalized understanding and had no additional questions. Routed to PCP.  

## 2020-08-12 DIAGNOSIS — R0600 Dyspnea, unspecified: Secondary | ICD-10-CM | POA: Diagnosis not present

## 2020-08-12 DIAGNOSIS — E782 Mixed hyperlipidemia: Secondary | ICD-10-CM | POA: Diagnosis not present

## 2020-08-12 LAB — CBC WITH DIFFERENTIAL/PLATELET
Basophils Absolute: 0 10*3/uL (ref 0.0–0.2)
Basos: 1 %
EOS (ABSOLUTE): 0.2 10*3/uL (ref 0.0–0.4)
Eos: 4 %
Hematocrit: 45.8 % (ref 37.5–51.0)
Hemoglobin: 15.7 g/dL (ref 13.0–17.7)
Immature Grans (Abs): 0 10*3/uL (ref 0.0–0.1)
Immature Granulocytes: 1 %
Lymphocytes Absolute: 1.5 10*3/uL (ref 0.7–3.1)
Lymphs: 28 %
MCH: 30.1 pg (ref 26.6–33.0)
MCHC: 34.3 g/dL (ref 31.5–35.7)
MCV: 88 fL (ref 79–97)
Monocytes Absolute: 0.4 10*3/uL (ref 0.1–0.9)
Monocytes: 7 %
Neutrophils Absolute: 3.3 10*3/uL (ref 1.4–7.0)
Neutrophils: 59 %
Platelets: 231 10*3/uL (ref 150–450)
RBC: 5.21 x10E6/uL (ref 4.14–5.80)
RDW: 12.6 % (ref 11.6–15.4)
WBC: 5.5 10*3/uL (ref 3.4–10.8)

## 2020-08-12 LAB — BASIC METABOLIC PANEL
BUN/Creatinine Ratio: 12 (ref 9–20)
BUN: 13 mg/dL (ref 6–24)
CO2: 27 mmol/L (ref 20–29)
Calcium: 9.7 mg/dL (ref 8.7–10.2)
Chloride: 103 mmol/L (ref 96–106)
Creatinine, Ser: 1.09 mg/dL (ref 0.76–1.27)
GFR calc Af Amer: 90 mL/min/{1.73_m2} (ref >59)
GFR calc non Af Amer: 78 mL/min/{1.73_m2} (ref >59)
Glucose: 98 mg/dL (ref 65–99)
Potassium: 5 mmol/L (ref 3.5–5.2)
Sodium: 140 mmol/L (ref 134–144)

## 2020-08-12 LAB — HEPATIC FUNCTION PANEL
ALT: 40 IU/L (ref 0–44)
AST: 17 IU/L (ref 0–40)
Albumin: 4.8 g/dL (ref 3.8–4.9)
Alkaline Phosphatase: 94 IU/L (ref 44–121)
Bilirubin Total: 0.6 mg/dL (ref 0.0–1.2)
Bilirubin, Direct: 0.15 mg/dL (ref 0.00–0.40)
Total Protein: 6.9 g/dL (ref 6.0–8.5)

## 2020-08-12 LAB — LIPID PANEL
Chol/HDL Ratio: 5.9 ratio — ABNORMAL HIGH (ref 0.0–5.0)
Cholesterol, Total: 183 mg/dL (ref 100–199)
HDL: 31 mg/dL — ABNORMAL LOW (ref >39)
LDL Chol Calc (NIH): 123 mg/dL — ABNORMAL HIGH (ref 0–99)
Triglycerides: 163 mg/dL — ABNORMAL HIGH (ref 0–149)
VLDL Cholesterol Cal: 29 mg/dL (ref 5–40)

## 2020-08-12 LAB — TSH: TSH: 1.84 u[IU]/mL (ref 0.450–4.500)

## 2020-08-13 MED ORDER — ROSUVASTATIN CALCIUM 10 MG PO TABS: 10.0000 mg | ORAL_TABLET | Freq: Every day | ORAL | 3 refills | Status: DC

## 2020-08-13 NOTE — Addendum Note (Signed)
Addended by: Eleonore Chiquito on: 08/13/2020 08:11 AM   Modules accepted: Orders

## 2020-12-12 DIAGNOSIS — G4730 Sleep apnea, unspecified: Secondary | ICD-10-CM | POA: Diagnosis not present

## 2020-12-12 DIAGNOSIS — Z79899 Other long term (current) drug therapy: Secondary | ICD-10-CM | POA: Diagnosis not present

## 2020-12-12 DIAGNOSIS — Z125 Encounter for screening for malignant neoplasm of prostate: Secondary | ICD-10-CM | POA: Diagnosis not present

## 2020-12-12 DIAGNOSIS — E782 Mixed hyperlipidemia: Secondary | ICD-10-CM | POA: Diagnosis not present

## 2020-12-12 DIAGNOSIS — L309 Dermatitis, unspecified: Secondary | ICD-10-CM | POA: Diagnosis not present

## 2020-12-12 DIAGNOSIS — Z0289 Encounter for other administrative examinations: Secondary | ICD-10-CM | POA: Diagnosis not present

## 2020-12-12 DIAGNOSIS — Z23 Encounter for immunization: Secondary | ICD-10-CM | POA: Diagnosis not present

## 2020-12-12 DIAGNOSIS — Z111 Encounter for screening for respiratory tuberculosis: Secondary | ICD-10-CM | POA: Diagnosis not present

## 2021-01-12 ENCOUNTER — Ambulatory Visit: Payer: BC Managed Care – PPO | Admitting: Cardiology

## 2021-01-12 ENCOUNTER — Encounter: Payer: Self-pay | Admitting: Cardiology

## 2021-01-12 ENCOUNTER — Other Ambulatory Visit: Payer: Self-pay

## 2021-01-12 VITALS — BP 132/76 | HR 82 | Ht 71.0 in | Wt 219.4 lb

## 2021-01-12 DIAGNOSIS — E669 Obesity, unspecified: Secondary | ICD-10-CM | POA: Diagnosis not present

## 2021-01-12 DIAGNOSIS — R931 Abnormal findings on diagnostic imaging of heart and coronary circulation: Secondary | ICD-10-CM

## 2021-01-12 DIAGNOSIS — Z1329 Encounter for screening for other suspected endocrine disorder: Secondary | ICD-10-CM | POA: Diagnosis not present

## 2021-01-12 DIAGNOSIS — E66811 Obesity, class 1: Secondary | ICD-10-CM

## 2021-01-12 DIAGNOSIS — E785 Hyperlipidemia, unspecified: Secondary | ICD-10-CM

## 2021-01-12 HISTORY — DX: Obesity, class 1: E66.811

## 2021-01-12 HISTORY — DX: Obesity, unspecified: E66.9

## 2021-01-12 HISTORY — DX: Abnormal findings on diagnostic imaging of heart and coronary circulation: R93.1

## 2021-01-12 NOTE — Progress Notes (Signed)
Cardiology Office Note:    Date:  01/12/2021   ID:  Foxx Klarich, DOB Dec 04, 1970, MRN [id number]  PCP:  Julianne Handler, NP  Cardiologist:  Garwin Brothers, MD   Referring MD: Julianne Handler, NP    ASSESSMENT:    1. Hyperlipidemia, unspecified hyperlipidemia type   2. Thyroid disorder screening   3. Elevated coronary artery calcium score   4. Obesity (BMI 30.0-34.9)    PLAN:    In order of problems listed above:  1. Elevated calcium score: Secondary prevention stressed with the patient.  Importance of compliance with diet medication stressed any vocalized understanding.  He does take a coated baby aspirin on a daily basis. Essential hypertension: Blood pressure stable and diet was emphasized. Mixed dyslipidemia: Lipids were reviewed.  He will be back in the next few days for blood work.  He is on statin therapy.  Diet and lifestyle was emphasized. Obesity: Diet was emphasized.  Risks of abdominal obesity stressed and he promises to do better. Patient will be seen in follow-up appointment in 6 months or earlier if the patient has any concerns   Medication Adjustments/Labs and Tests Ordered: Current medicines are reviewed at length with the patient today.  Concerns regarding medicines are outlined above.  Orders Placed This Encounter  Procedures  . Basic metabolic panel  . Hepatic function panel  . Lipid panel  . TSH   No orders of the defined types were placed in this encounter.    No chief complaint on file.    History of Present Illness:    Francisco Underwood is a 50 y.o. male.  Patient has elevated calcium score, essential hypertension and dyslipidemia.  He denies any problems at this time and takes care of daily living.  No chest pain orthopnea or PND.  He is active and fully employed but does not exercise on a regular basis.  At the time of my evaluation, the patient is alert awake oriented and in no distress.  He has abdominal obesity.  Past Medical History:   Diagnosis Date  . Anxiety   . Asthma, extrinsic   . Depression screening   . DOE (dyspnea on exertion) 04/02/2020  . Dyspnea on exertion   . History of COVID-19   . HLD (hyperlipidemia)   . Major depression in remission (HCC)   . Mixed dyslipidemia 04/02/2020  . Mixed hyperlipidemia   . Obesity   . Sleep apnea   . Sleep apnea     Past Surgical History:  Procedure Laterality Date  . NO PAST SURGERIES      Current Medications: Current Meds  Medication Sig  . beclomethasone (QVAR) 80 MCG/ACT inhaler Inhale 1 puff into the lungs as needed (shortness of breath).  Marland Kitchen buPROPion (WELLBUTRIN XL) 150 MG 24 hr tablet Take 150 mg by mouth daily.  Marland Kitchen escitalopram (LEXAPRO) 20 MG tablet Take 20 mg by mouth daily.  . rosuvastatin (CRESTOR) 10 MG tablet Take 10 mg by mouth daily.  Marland Kitchen triamcinolone cream (KENALOG) 0.1 % Apply 1 application topically as needed (rash).     Allergies:   Patient has no known allergies.   Social History   Socioeconomic History  . Marital status: Married    Spouse name: Not on file  . Number of children: 3  . Years of education: Not on file  . Highest education level: Not on file  Occupational History  . Not on file  Tobacco Use  . Smoking status: Former Smoker  Quit date: 10/18/1989    Years since quitting: 31.2  . Smokeless tobacco: Never Used  Substance and Sexual Activity  . Alcohol use: No  . Drug use: No  . Sexual activity: Not on file  Other Topics Concern  . Not on file  Social History Narrative  . Not on file   Social Determinants of Health   Financial Resource Strain: Not on file  Food Insecurity: Not on file  Transportation Needs: Not on file  Physical Activity: Not on file  Stress: Not on file  Social Connections: Not on file     Family History: The patient's family history includes Diabetes in an other family member; Heart attack in his maternal grandfather and maternal uncle; Hypertension in an other family member.  ROS:    Please see the history of present illness.    All other systems reviewed and are negative.  EKGs/Labs/Other Studies Reviewed:    The following studies were reviewed today:  IMPRESSION: Coronary calcium score of 93.1. This was 86th percentile for age and sex matched control. Calcium scoring also sent to PACs system.  Aortic Atherosclerosis noted  Riley Lam MD   Electronically Signed   By: Marlan Palau MD   On: 07/25/2020 12:32   Recent Labs: 08/12/2020: ALT 40; BUN 13; Creatinine, Ser 1.09; Hemoglobin 15.7; Platelets 231; Potassium 5.0; Sodium 140; TSH 1.840  Recent Lipid Panel    Component Value Date/Time   CHOL 183 08/12/2020 0917   TRIG 163 (H) 08/12/2020 0917   HDL 31 (L) 08/12/2020 0917   CHOLHDL 5.9 (H) 08/12/2020 0917   LDLCALC 123 (H) 08/12/2020 0917    Physical Exam:    VS:  BP 132/76   Pulse 82   Ht 5\' 11"  (1.803 m)   Wt 219 lb 6.4 oz (99.5 kg)   SpO2 96%   BMI 30.60 kg/m     Wt Readings from Last 3 Encounters:  01/12/21 219 lb 6.4 oz (99.5 kg)  07/03/20 222 lb 6.4 oz (100.9 kg)  04/09/20 220 lb (99.8 kg)     GEN: Patient is in no acute distress HEENT: Normal NECK: No JVD; No carotid bruits LYMPHATICS: No lymphadenopathy CARDIAC: Hear sounds regular, 2/6 systolic murmur at the apex. RESPIRATORY:  Clear to auscultation without rales, wheezing or rhonchi  ABDOMEN: Soft, non-tender, non-distended MUSCULOSKELETAL:  No edema; No deformity  SKIN: Warm and dry NEUROLOGIC:  Alert and oriented x 3 PSYCHIATRIC:  Normal affect   Signed, 04/11/20, MD  01/12/2021 4:44 PM    [facility]

## 2021-01-12 NOTE — Patient Instructions (Signed)

## 2021-01-15 DIAGNOSIS — Z01818 Encounter for other preprocedural examination: Secondary | ICD-10-CM | POA: Diagnosis not present

## 2021-02-20 DIAGNOSIS — Z1211 Encounter for screening for malignant neoplasm of colon: Secondary | ICD-10-CM | POA: Diagnosis not present

## 2021-02-20 DIAGNOSIS — Z803 Family history of malignant neoplasm of breast: Secondary | ICD-10-CM | POA: Diagnosis not present

## 2021-02-20 DIAGNOSIS — Z800 Family history of malignant neoplasm of digestive organs: Secondary | ICD-10-CM | POA: Diagnosis not present

## 2021-02-20 DIAGNOSIS — F32A Depression, unspecified: Secondary | ICD-10-CM | POA: Diagnosis not present

## 2021-02-20 DIAGNOSIS — J45909 Unspecified asthma, uncomplicated: Secondary | ICD-10-CM | POA: Diagnosis not present

## 2021-02-20 DIAGNOSIS — Z09 Encounter for follow-up examination after completed treatment for conditions other than malignant neoplasm: Secondary | ICD-10-CM | POA: Diagnosis not present

## 2021-02-20 DIAGNOSIS — Z86010 Personal history of colonic polyps: Secondary | ICD-10-CM | POA: Diagnosis not present

## 2021-07-16 ENCOUNTER — Other Ambulatory Visit: Payer: Self-pay

## 2021-07-23 ENCOUNTER — Encounter: Payer: Self-pay | Admitting: Cardiology

## 2021-07-23 ENCOUNTER — Ambulatory Visit: Payer: BC Managed Care – PPO | Admitting: Cardiology

## 2021-07-23 ENCOUNTER — Other Ambulatory Visit: Payer: Self-pay

## 2021-07-23 VITALS — BP 128/76 | HR 97 | Ht 72.0 in | Wt 236.0 lb

## 2021-07-23 DIAGNOSIS — R931 Abnormal findings on diagnostic imaging of heart and coronary circulation: Secondary | ICD-10-CM

## 2021-07-23 DIAGNOSIS — E669 Obesity, unspecified: Secondary | ICD-10-CM

## 2021-07-23 DIAGNOSIS — E782 Mixed hyperlipidemia: Secondary | ICD-10-CM | POA: Diagnosis not present

## 2021-07-23 NOTE — Progress Notes (Signed)
Cardiology Office Note:    Date:  07/23/2021   ID:  Francisco Underwood, DOB November 18, 1970, MRN [id number]  PCP:  Julianne Handler, NP  Cardiologist:  Garwin Brothers, MD   Referring MD: Julianne Handler, NP    ASSESSMENT:    1. Elevated coronary artery calcium score   2. Mixed hyperlipidemia   3. Mixed dyslipidemia   4. Obesity (BMI 30.0-34.9)    PLAN:    In order of problems listed above:  Coronary artery disease: Secondary prevention stressed with the patient.  Importance of compliance with diet medication stressed and he vocalized understanding.  I advised him to walk at least half an hour a day 5 days a week and he promises to do so. Essential hypertension: Blood pressure stable and diet was emphasized.  Lifestyle modification urged. Mixed dyslipidemia: Diet was emphasized.  He will be back in the next few days for blood work including lipids it has been a long time since his lipids were checked. Obesity: Significant weight gain in the past several months.  I cautioned him against this and he understands.  He is going to try to do better. Patient will be seen in follow-up appointment in 6 months or earlier if the patient has any concerns    Medication Adjustments/Labs and Tests Ordered: Current medicines are reviewed at length with the patient today.  Concerns regarding medicines are outlined above.  Orders Placed This Encounter  Procedures   Basic metabolic panel   Hepatic function panel   Lipid panel   EKG 12-Lead   No orders of the defined types were placed in this encounter.    No chief complaint on file.    History of Present Illness:    Francisco Underwood is a 50 y.o. male.  Patient has past medical history of coronary artery calcification, elevated calcium score mixed dyslipidemia and obesity.  He denies any problems at this time and takes care of activities of daily living.  No chest pain orthopnea or PND.  He has gained a significant amount of weight in the past several  months.  At the time of my evaluation, the patient is alert awake oriented and in no distress.  Past Medical History:  Diagnosis Date   Anxiety    Asthma, extrinsic    Depression screening    DOE (dyspnea on exertion) 04/02/2020   Dyspnea on exertion    Elevated coronary artery calcium score 01/12/2021   History of COVID-19    HLD (hyperlipidemia)    Major depression in remission (HCC)    Mixed dyslipidemia 04/02/2020   Mixed hyperlipidemia    Obesity    Obesity (BMI 30.0-34.9) 01/12/2021   Sleep apnea    Sleep apnea     Past Surgical History:  Procedure Laterality Date   NO PAST SURGERIES      Current Medications: Current Meds  Medication Sig   beclomethasone (QVAR) 80 MCG/ACT inhaler Inhale 1 puff into the lungs as needed for shortness of breath.   buPROPion (WELLBUTRIN XL) 150 MG 24 hr tablet Take 150 mg by mouth daily.   escitalopram (LEXAPRO) 20 MG tablet Take 20 mg by mouth daily.   rosuvastatin (CRESTOR) 10 MG tablet Take 10 mg by mouth daily.   triamcinolone cream (KENALOG) 0.1 % Apply 1 application topically as needed for rash.     Allergies:   Patient has no known allergies.   Social History   Socioeconomic History   Marital status: Married    Spouse  name: Not on file   Number of children: 3   Years of education: Not on file   Highest education level: Not on file  Occupational History   Not on file  Tobacco Use   Smoking status: Former    Types: Cigarettes    Quit date: 10/18/1989    Years since quitting: 31.7   Smokeless tobacco: Never  Substance and Sexual Activity   Alcohol use: No   Drug use: No   Sexual activity: Not on file  Other Topics Concern   Not on file  Social History Narrative   Not on file   Social Determinants of Health   Financial Resource Strain: Not on file  Food Insecurity: Not on file  Transportation Needs: Not on file  Physical Activity: Not on file  Stress: Not on file  Social Connections: Not on file     Family  History: The patient's family history includes Diabetes in an other family member; Heart attack in his maternal grandfather and maternal uncle; Hypertension in an other family member.  ROS:   Please see the history of present illness.    All other systems reviewed and are negative.  EKGs/Labs/Other Studies Reviewed:    The following studies were reviewed today: EKG reveals sinus rhythm and nonspecific ST-T changes   Recent Labs: 08/12/2020: ALT 40; BUN 13; Creatinine, Ser 1.09; Hemoglobin 15.7; Platelets 231; Potassium 5.0; Sodium 140; TSH 1.840  Recent Lipid Panel    Component Value Date/Time   CHOL 183 08/12/2020 0917   TRIG 163 (H) 08/12/2020 0917   HDL 31 (L) 08/12/2020 0917   CHOLHDL 5.9 (H) 08/12/2020 0917   LDLCALC 123 (H) 08/12/2020 0917    Physical Exam:    VS:  BP 128/76   Pulse 97   Ht 6' (1.829 m)   Wt 236 lb (107 kg)   SpO2 96%   BMI 32.01 kg/m     Wt Readings from Last 3 Encounters:  07/23/21 236 lb (107 kg)  01/12/21 219 lb 6.4 oz (99.5 kg)  07/03/20 222 lb 6.4 oz (100.9 kg)     GEN: Patient is in no acute distress HEENT: Normal NECK: No JVD; No carotid bruits LYMPHATICS: No lymphadenopathy CARDIAC: Hear sounds regular, 2/6 systolic murmur at the apex. RESPIRATORY:  Clear to auscultation without rales, wheezing or rhonchi  ABDOMEN: Soft, non-tender, non-distended MUSCULOSKELETAL:  No edema; No deformity  SKIN: Warm and dry NEUROLOGIC:  Alert and oriented x 3 PSYCHIATRIC:  Normal affect   Signed, Garwin Brothers, MD  07/23/2021 2:49 PM    [facility]

## 2021-07-23 NOTE — Patient Instructions (Signed)

## 2021-07-28 DIAGNOSIS — R931 Abnormal findings on diagnostic imaging of heart and coronary circulation: Secondary | ICD-10-CM | POA: Diagnosis not present

## 2021-07-28 DIAGNOSIS — E782 Mixed hyperlipidemia: Secondary | ICD-10-CM | POA: Diagnosis not present

## 2021-07-28 LAB — BASIC METABOLIC PANEL
BUN/Creatinine Ratio: 12 (ref 9–20)
BUN: 14 mg/dL (ref 6–24)
CO2: 26 mmol/L (ref 20–29)
Calcium: 9.3 mg/dL (ref 8.7–10.2)
Chloride: 101 mmol/L (ref 96–106)
Creatinine, Ser: 1.13 mg/dL (ref 0.76–1.27)
Glucose: 102 mg/dL — ABNORMAL HIGH (ref 70–99)
Potassium: 5.1 mmol/L (ref 3.5–5.2)
Sodium: 136 mmol/L (ref 134–144)
eGFR: 78 mL/min/{1.73_m2} (ref >59)

## 2021-07-28 LAB — LIPID PANEL
Chol/HDL Ratio: 6.8 ratio — ABNORMAL HIGH (ref 0.0–5.0)
Cholesterol, Total: 217 mg/dL — ABNORMAL HIGH (ref 100–199)
HDL: 32 mg/dL — ABNORMAL LOW (ref >39)
LDL Chol Calc (NIH): 154 mg/dL — ABNORMAL HIGH (ref 0–99)
Triglycerides: 168 mg/dL — ABNORMAL HIGH (ref 0–149)
VLDL Cholesterol Cal: 31 mg/dL (ref 5–40)

## 2021-07-28 LAB — HEPATIC FUNCTION PANEL
ALT: 39 IU/L (ref 0–44)
AST: 21 IU/L (ref 0–40)
Albumin: 4.6 g/dL (ref 3.8–4.9)
Alkaline Phosphatase: 89 IU/L (ref 44–121)
Bilirubin Total: 0.5 mg/dL (ref 0.0–1.2)
Bilirubin, Direct: 0.13 mg/dL (ref 0.00–0.40)
Total Protein: 6.7 g/dL (ref 6.0–8.5)

## 2021-07-31 MED ORDER — ROSUVASTATIN CALCIUM 20 MG PO TABS: 20.0000 mg | ORAL_TABLET | Freq: Every day | ORAL | 3 refills | Status: AC

## 2021-07-31 NOTE — Addendum Note (Signed)
Addended by: Eleonore Chiquito on: 07/31/2021 08:50 AM   Modules accepted: Orders

## 2022-05-26 DIAGNOSIS — L309 Dermatitis, unspecified: Secondary | ICD-10-CM | POA: Diagnosis not present

## 2022-05-26 DIAGNOSIS — F325 Major depressive disorder, single episode, in full remission: Secondary | ICD-10-CM | POA: Diagnosis not present

## 2022-05-26 DIAGNOSIS — E782 Mixed hyperlipidemia: Secondary | ICD-10-CM | POA: Diagnosis not present

## 2022-05-26 DIAGNOSIS — J45909 Unspecified asthma, uncomplicated: Secondary | ICD-10-CM | POA: Diagnosis not present

## 2022-10-20 DIAGNOSIS — G4733 Obstructive sleep apnea (adult) (pediatric): Secondary | ICD-10-CM | POA: Diagnosis not present

## 2022-11-22 DIAGNOSIS — J45901 Unspecified asthma with (acute) exacerbation: Secondary | ICD-10-CM | POA: Diagnosis not present

## 2022-11-22 DIAGNOSIS — R55 Syncope and collapse: Secondary | ICD-10-CM | POA: Diagnosis not present

## 2022-11-22 DIAGNOSIS — R059 Cough, unspecified: Secondary | ICD-10-CM | POA: Diagnosis not present

## 2022-11-22 DIAGNOSIS — Z6834 Body mass index (BMI) 34.0-34.9, adult: Secondary | ICD-10-CM | POA: Diagnosis not present
